# Patient Record
Sex: Male | Born: 1995 | Race: White | Hispanic: No | Marital: Single | Smoking: Current some day smoker
Health system: Southern US, Community
[De-identification: ages and names within clinical notes are randomized; demographics above are authoritative.]

---
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
## 2016-11-27 ENCOUNTER — Emergency Department (HOSPITAL_COMMUNITY): Payer: Self-pay

## 2016-11-27 ENCOUNTER — Emergency Department (HOSPITAL_COMMUNITY)
Admission: EM | Admit: 2016-11-27 | Discharge: 2016-11-28 | Disposition: A | Discharge status: Home / Self Care | Payer: Self-pay | Attending: Emergency Medicine | Admitting: Emergency Medicine

## 2016-11-27 ENCOUNTER — Encounter (HOSPITAL_COMMUNITY): Payer: Self-pay | Admitting: Emergency Medicine

## 2016-11-27 DIAGNOSIS — K2900 Acute gastritis without bleeding: Secondary | ICD-10-CM | POA: Insufficient documentation

## 2016-11-27 DIAGNOSIS — F17200 Nicotine dependence, unspecified, uncomplicated: Secondary | ICD-10-CM | POA: Insufficient documentation

## 2016-11-27 LAB — COMPREHENSIVE METABOLIC PANEL
ALBUMIN: 4.4 g/dL (ref 3.5–5.0)
ALT: 43 U/L (ref 17–63)
ANION GAP: 11 (ref 5–15)
AST: 49 U/L — ABNORMAL HIGH (ref 15–41)
Alkaline Phosphatase: 57 U/L (ref 38–126)
BILIRUBIN TOTAL: 0.6 mg/dL (ref 0.3–1.2)
BUN: 14 mg/dL (ref 6–20)
CALCIUM: 9.5 mg/dL (ref 8.9–10.3)
CO2: 23 mmol/L (ref 22–32)
Chloride: 102 mmol/L (ref 101–111)
Creatinine, Ser: 1.11 mg/dL (ref 0.61–1.24)
GFR calc non Af Amer: >60 mL/min (ref >60)
GLUCOSE: 91 mg/dL (ref 65–99)
POTASSIUM: 3.8 mmol/L (ref 3.5–5.1)
SODIUM: 136 mmol/L (ref 135–145)
TOTAL PROTEIN: 8.1 g/dL (ref 6.5–8.1)

## 2016-11-27 LAB — URINALYSIS, ROUTINE W REFLEX MICROSCOPIC
BILIRUBIN URINE: NEGATIVE
Glucose, UA: NEGATIVE mg/dL
Hgb urine dipstick: NEGATIVE
Ketones, ur: NEGATIVE mg/dL
LEUKOCYTES UA: NEGATIVE
NITRITE: NEGATIVE
PH: 7 (ref 5.0–8.0)
Protein, ur: NEGATIVE mg/dL
SPECIFIC GRAVITY, URINE: 1.017 (ref 1.005–1.030)

## 2016-11-27 LAB — CBC
HEMATOCRIT: 45.8 % (ref 39.0–52.0)
HEMOGLOBIN: 16.6 g/dL (ref 13.0–17.0)
MCH: 32.9 pg (ref 26.0–34.0)
MCHC: 36.2 g/dL — AB (ref 30.0–36.0)
MCV: 90.9 fL (ref 78.0–100.0)
Platelets: 200 10*3/uL (ref 150–400)
RBC: 5.04 MIL/uL (ref 4.22–5.81)
RDW: 13.5 % (ref 11.5–15.5)
WBC: 9.7 10*3/uL (ref 4.0–10.5)

## 2016-11-27 LAB — LIPASE, BLOOD: Lipase: 22 U/L (ref 11–51)

## 2016-11-27 MED ORDER — SUCRALFATE 1 G PO TABS
1.0000 g | ORAL_TABLET | Freq: Once | ORAL | Status: AC
  Administered 2016-11-28: 1 g via ORAL
  Filled 2016-11-27: qty 1

## 2016-11-27 MED ORDER — PANTOPRAZOLE SODIUM 40 MG PO TBEC
80.0000 mg | DELAYED_RELEASE_TABLET | Freq: Once | ORAL | Status: AC
  Administered 2016-11-28: 80 mg via ORAL
  Filled 2016-11-27: qty 2

## 2016-11-27 NOTE — ED Notes (Signed)
Pt tx'd to Xray. Will medicate when he returns

## 2016-11-27 NOTE — ED Triage Notes (Signed)
Pt from home with c/o mid abd pain x 3 days. Pt denies emesis, but reports it as a sharp pain. Pt reports he has recently been drinking a fifth of liquor a day.

## 2016-11-28 MED ORDER — PANTOPRAZOLE SODIUM 20 MG PO TBEC: 20.0000 mg | DELAYED_RELEASE_TABLET | Freq: Two times a day (BID) | ORAL | 0 refills | Status: AC

## 2016-11-28 MED ORDER — SUCRALFATE 1 G PO TABS: 1.0000 g | ORAL_TABLET | Freq: Three times a day (TID) | ORAL | 0 refills | Status: AC

## 2016-11-28 NOTE — ED Notes (Signed)
Pt educated to follow up with his PCP about his elevated BP. He states that it is usually WDL and he thinks it is his nerves. He verbalizes that he will continue to monitor it.

## 2016-11-29 NOTE — ED Provider Notes (Signed)
[facility]-EMERGENCY DEPT Provider Note   CSN: [id number] Arrival date & time: 11/27/16  1929     History   Chief Complaint Chief Complaint  Patient presents with  . Abdominal Pain    HPI Mitchell Lopez is a 21 y.o. male.  HPI Patient is a healthy 21 year old male who reports upper abdominal discomfort over the past 3 days.  Reports nausea without vomiting.  Reports sharp upper abdominal discomfort.  He does report increased liquor intake over the past week.  No changes appetite.  No fevers and chills.  Denies diarrhea.  Some radiation of his discomfort towards his chest.   History reviewed. No pertinent past medical history.  There are no active problems to display for this patient.   History reviewed. No pertinent surgical history.     Home Medications    Prior to Admission medications   Medication Sig Start Date End Date Taking? Authorizing Provider  pantoprazole (PROTONIX) 20 MG tablet Take 1 tablet (20 mg total) by mouth 2 (two) times daily. 11/28/16   Azalia Bilisampos, Benedict Kue, MD  sucralfate (CARAFATE) 1 g tablet Take 1 tablet (1 g total) by mouth 4 (four) times daily -  with meals and at bedtime. 11/28/16   Azalia Bilisampos, Jasmin Winberry, MD    Family History No family history on file.  Social History Social History  Substance Use Topics  . Smoking status: Current Some Day Smoker  . Smokeless tobacco: Not on file  . Alcohol use Yes     Allergies   Amoxicillin   Review of Systems Review of Systems  All other systems reviewed and are negative.    Physical Exam Updated Vital Signs BP (!) 149/94 (BP Location: Right Arm)   Pulse 67   Temp 98.9 F (37.2 C) (Oral)   Resp 18   SpO2 98%   Physical Exam  Constitutional: He is oriented to person, place, and time. He appears well-developed and well-nourished.  HENT:  Head: Normocephalic and atraumatic.  Eyes: EOM are normal.  Neck: Normal range of motion.  Cardiovascular: Normal rate, regular rhythm and normal heart sounds.     Pulmonary/Chest: Effort normal and breath sounds normal. No respiratory distress.  Abdominal: Soft. He exhibits no distension.  Mild upper abdominal epigastric tenderness  Musculoskeletal: Normal range of motion.  Neurological: He is alert and oriented to person, place, and time.  Skin: Skin is warm and dry.  Psychiatric: He has a normal mood and affect. Judgment normal.  Nursing note and vitals reviewed.    ED Treatments / Results  Labs (all labs ordered are listed, but only abnormal results are displayed) Labs Reviewed  COMPREHENSIVE METABOLIC PANEL - Abnormal; Notable for the following:       Result Value   AST 49 (*)    All other components within normal limits  CBC - Abnormal; Notable for the following:    MCHC 36.2 (*)    All other components within normal limits  LIPASE, BLOOD  URINALYSIS, ROUTINE W REFLEX MICROSCOPIC    EKG  EKG Interpretation None       Radiology Dg Abdomen Acute W/chest  Result Date: 11/27/2016 CLINICAL DATA:  Upper abdominal pain EXAM: DG ABDOMEN ACUTE W/ 1V CHEST COMPARISON:  11/03/2005 FINDINGS: Single-view chest demonstrates no acute consolidation or effusion. Normal cardiomediastinal silhouette. Supine and upright views of the abdomen demonstrate no free air beneath the diaphragm. Nonobstructed bowel-gas pattern with mild to moderate stool. No abnormal calcification. IMPRESSION: Negative abdominal radiographs.  No acute cardiopulmonary disease. Electronically  Signed   By: Jasmine Pang M.D.   On: 11/27/2016 23:40    Procedures Procedures (including critical care time)  Medications Ordered in ED Medications  sucralfate (CARAFATE) tablet 1 g (1 g Oral Given 11/28/16 0010)  pantoprazole (PROTONIX) EC tablet 80 mg (80 mg Oral Given 11/28/16 0010)     Initial Impression / Assessment and Plan / ED Course  I have reviewed the triage vital signs and the nursing notes.  Pertinent labs & imaging results that were available during my care of the  patient were reviewed by me and considered in my medical decision making (see chart for details).     Patient has been drinking heavily.  I suspect this is gastritis.  Labs without significant abnormalities.  Patient does not need advanced imaging of his abdomen at this time.  Recommended decreasing his alcohol intake in a daily PPI as well as Carafate.  He understands return to the ER for new or worsening symptoms  Final Clinical Impressions(s) / ED Diagnoses   Final diagnoses:  Acute gastritis without hemorrhage, unspecified gastritis type    New Prescriptions Discharge Medication List as of 11/28/2016  1:43 AM    START taking these medications   Details  pantoprazole (PROTONIX) 20 MG tablet Take 1 tablet (20 mg total) by mouth 2 (two) times daily., Starting Sat 11/28/2016, Print    sucralfate (CARAFATE) 1 g tablet Take 1 tablet (1 g total) by mouth 4 (four) times daily -  with meals and at bedtime., Starting Sat 11/28/2016, Print         Azalia Bilis, MD 11/29/16 [id number]

## 2018-09-06 IMAGING — CR DG ABDOMEN ACUTE W/ 1V CHEST
3 series · 3 of 3 positions shown · non-contrast
Comparison: 11/03/2005

CLINICAL DATA: Upper abdominal pain

EXAM:
DG ABDOMEN ACUTE W/ 1V CHEST

[w chest pa]
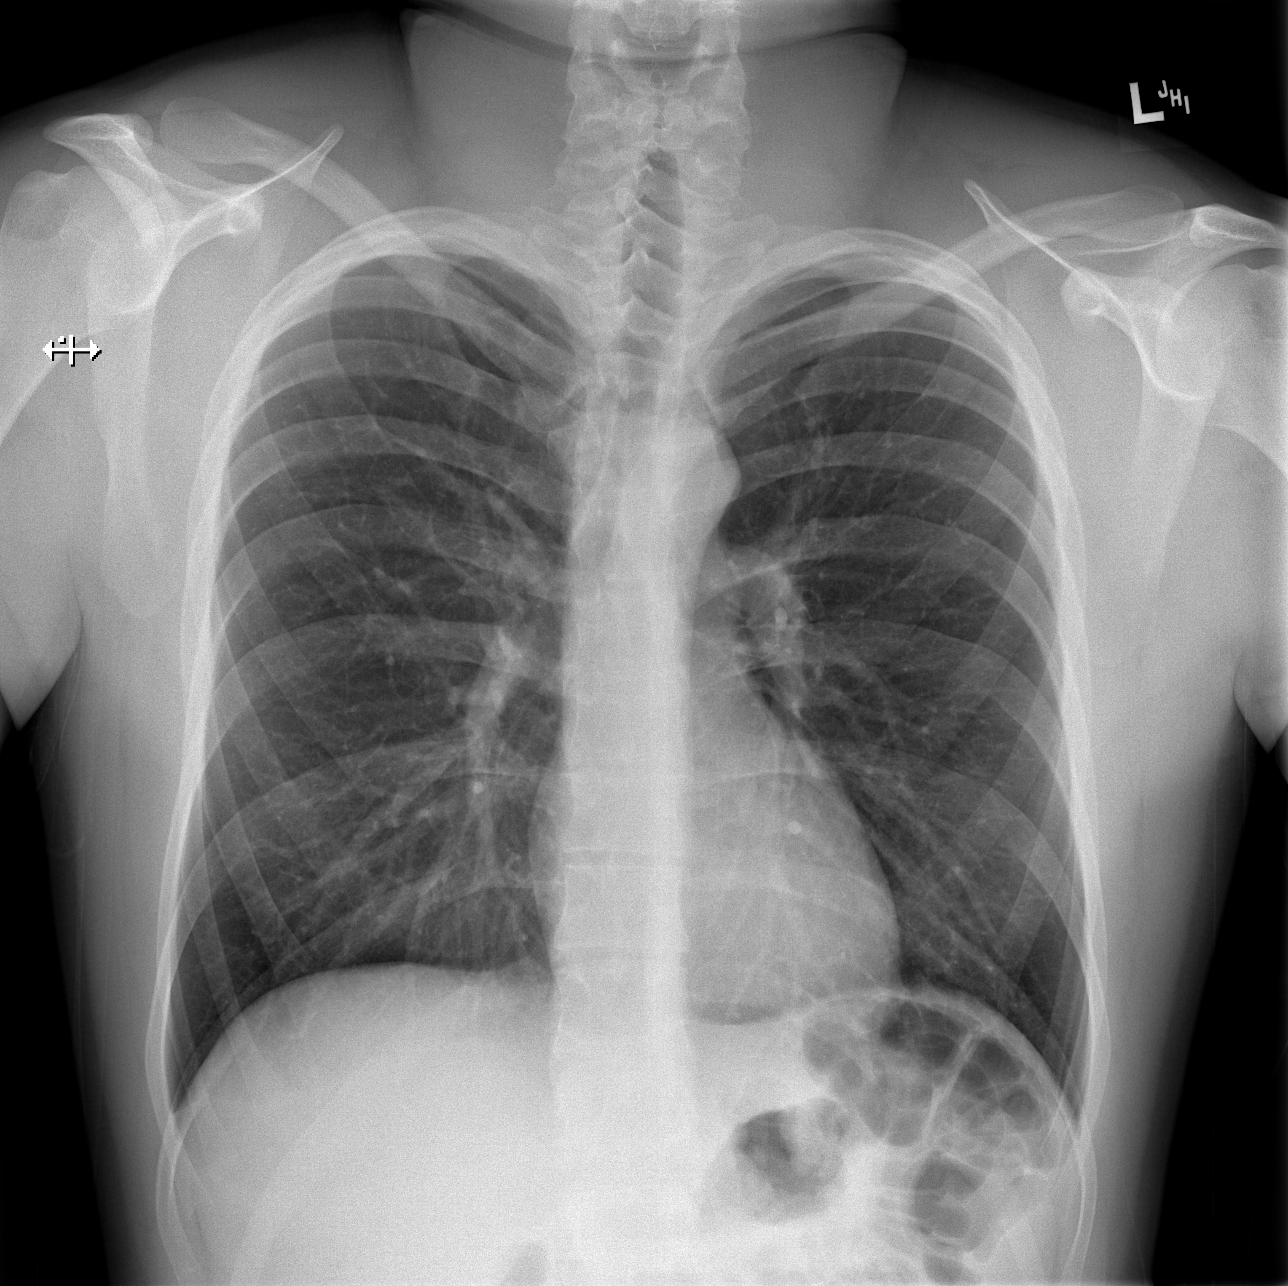

[w abdomen upright]
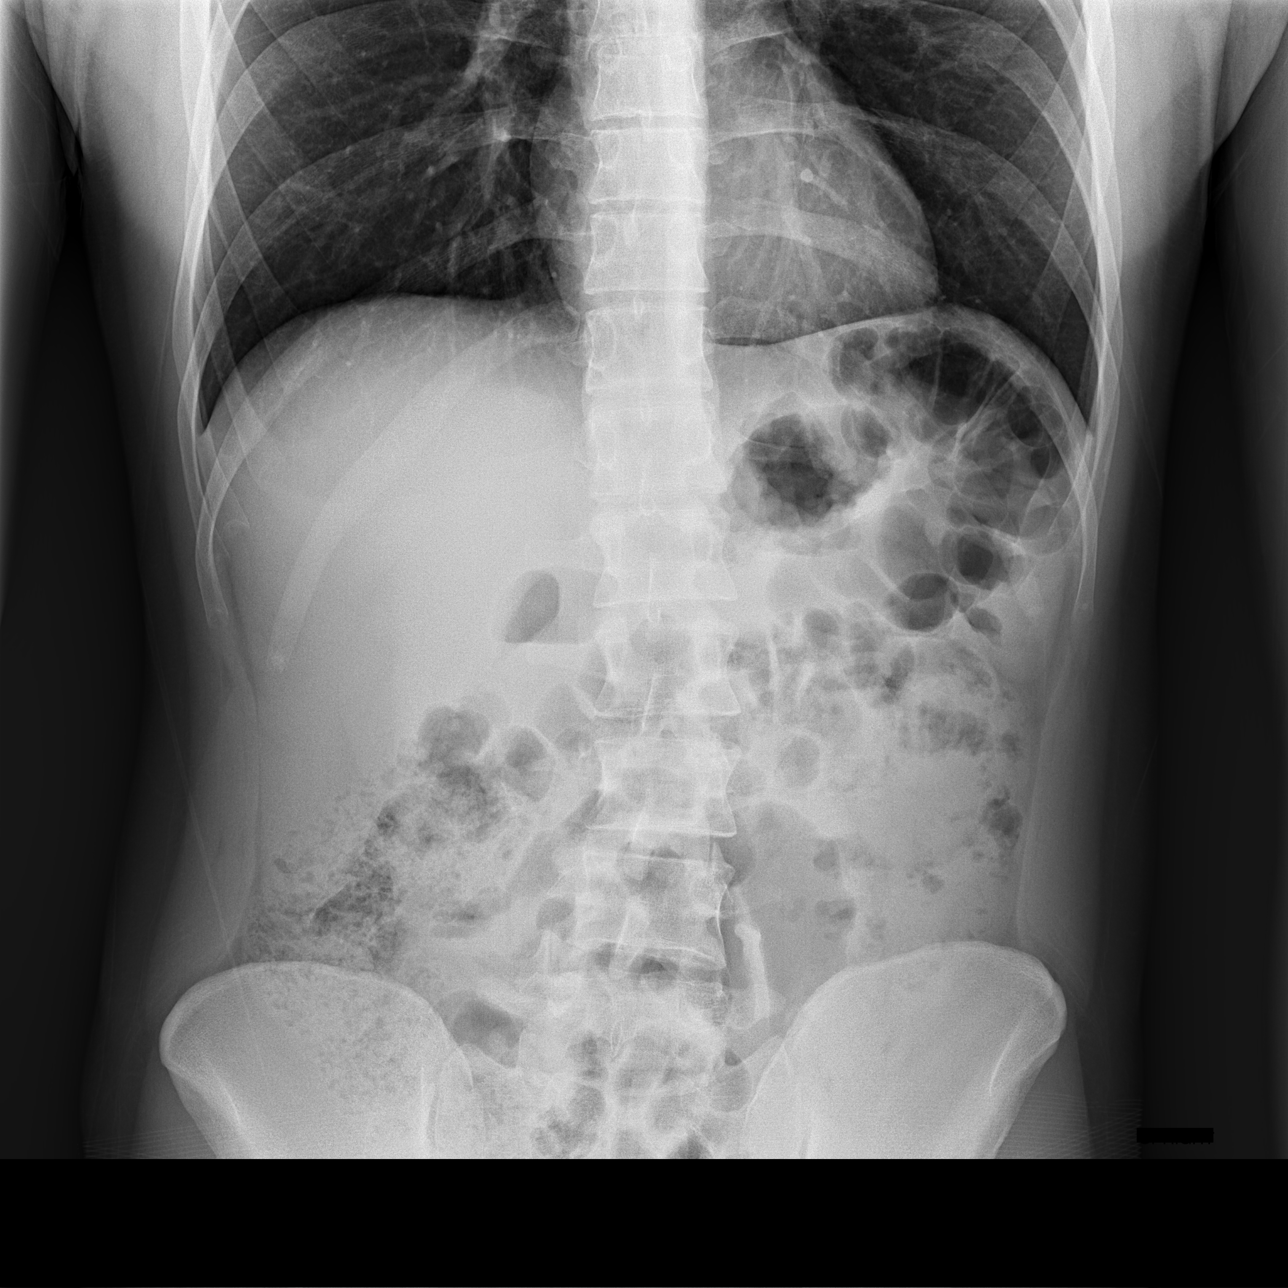

[t abdomen supine]
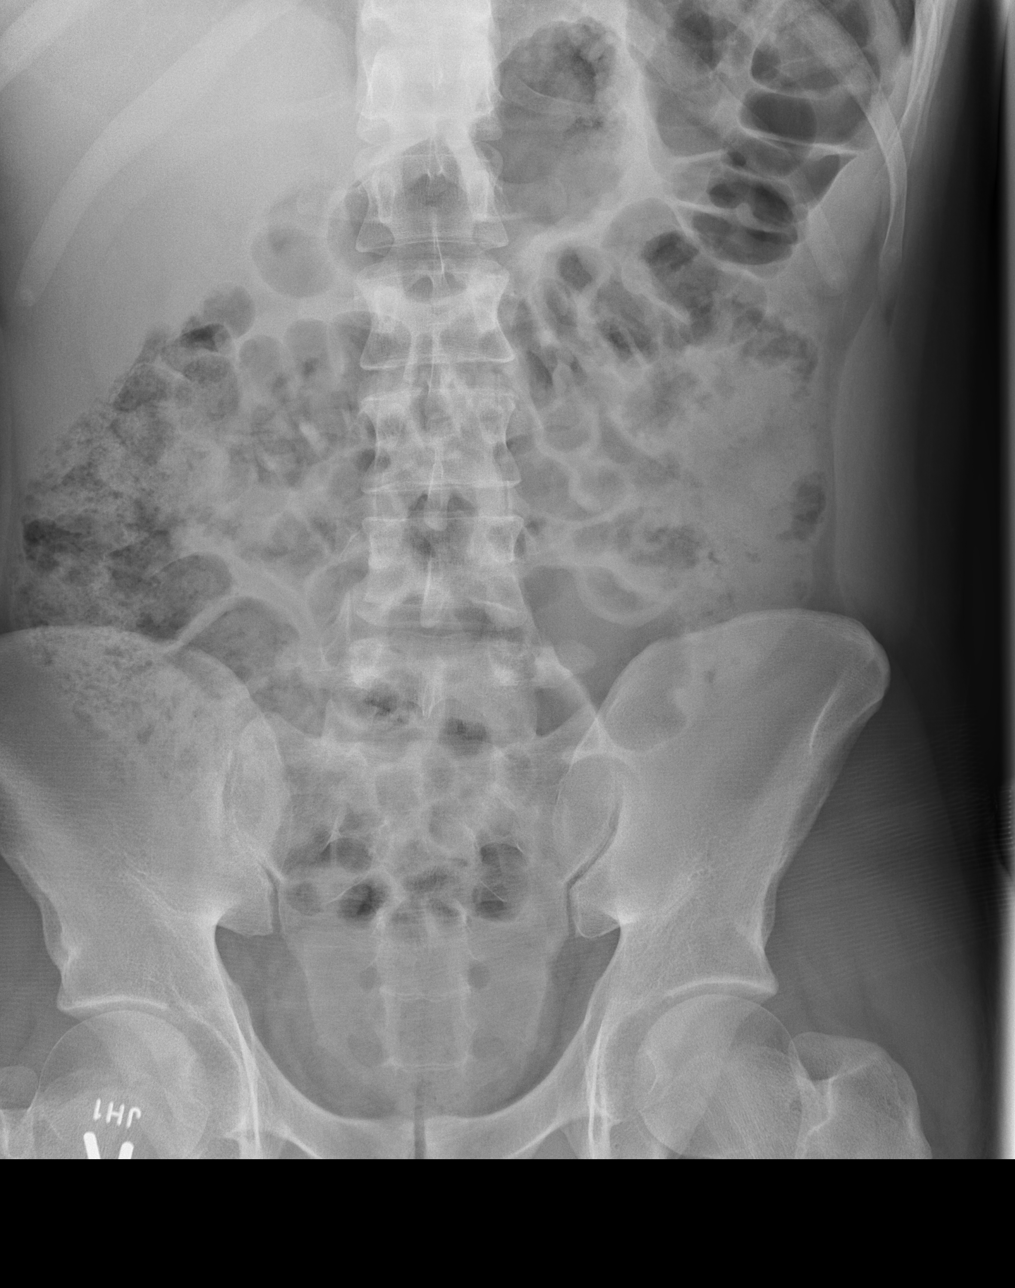

[3 of 3 positions shown; findings below may reference images not displayed]

FINDINGS: Single-view chest demonstrates no acute consolidation or effusion.
Normal cardiomediastinal silhouette.

Supine and upright views of the abdomen demonstrate no free air
beneath the diaphragm. Nonobstructed bowel-gas pattern with mild to
moderate stool. No abnormal calcification.
IMPRESSION: Negative abdominal radiographs.  No acute cardiopulmonary disease.
